# Patient Record
Sex: Male | Born: 1995 | Hispanic: Yes | Marital: Single | Smoking: Never smoker
Health system: Southern US, Community
[De-identification: ages and names within clinical notes are randomized; demographics above are authoritative.]

---

## 2019-01-27 ENCOUNTER — Emergency Department: Payer: No Typology Code available for payment source

## 2019-01-27 ENCOUNTER — Other Ambulatory Visit: Payer: Self-pay

## 2019-01-27 ENCOUNTER — Emergency Department
Admission: EM | Admit: 2019-01-27 | Discharge: 2019-01-27 | Disposition: A | Discharge status: Home / Self Care | Payer: No Typology Code available for payment source | Attending: Emergency Medicine | Admitting: Emergency Medicine

## 2019-01-27 ENCOUNTER — Encounter: Payer: Self-pay | Admitting: Emergency Medicine

## 2019-01-27 DIAGNOSIS — M545 Low back pain: Secondary | ICD-10-CM | POA: Diagnosis not present

## 2019-01-27 DIAGNOSIS — M546 Pain in thoracic spine: Secondary | ICD-10-CM | POA: Insufficient documentation

## 2019-01-27 DIAGNOSIS — M542 Cervicalgia: Secondary | ICD-10-CM | POA: Diagnosis not present

## 2019-01-27 MED ORDER — KETOROLAC TROMETHAMINE 30 MG/ML IJ SOLN
30.0000 mg | Freq: Once | INTRAMUSCULAR | Status: AC
  Administered 2019-01-27: 19:00:00 30 mg via INTRAMUSCULAR
  Filled 2019-01-27: qty 1

## 2019-01-27 MED ORDER — MELOXICAM 15 MG PO TABS: 15.0000 mg | ORAL_TABLET | Freq: Every day | ORAL | 1 refills | Status: AC
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
## 2019-01-27 NOTE — ED Notes (Signed)
Patient transported to X-ray 
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
## 2019-03-07 ENCOUNTER — Emergency Department
Admission: EM | Admit: 2019-03-07 | Discharge: 2019-03-07 | Disposition: A | Discharge status: Home / Self Care | Payer: Medicaid Other | Attending: Student in an Organized Health Care Education/Training Program | Admitting: Student in an Organized Health Care Education/Training Program

## 2019-03-07 ENCOUNTER — Other Ambulatory Visit: Payer: Self-pay

## 2019-03-07 ENCOUNTER — Emergency Department: Payer: Medicaid Other

## 2019-03-07 ENCOUNTER — Encounter: Payer: Self-pay | Admitting: Emergency Medicine

## 2019-03-07 DIAGNOSIS — Y929 Unspecified place or not applicable: Secondary | ICD-10-CM | POA: Diagnosis not present

## 2019-03-07 DIAGNOSIS — Y999 Unspecified external cause status: Secondary | ICD-10-CM | POA: Diagnosis not present

## 2019-03-07 DIAGNOSIS — S90512A Abrasion, left ankle, initial encounter: Secondary | ICD-10-CM | POA: Insufficient documentation

## 2019-03-07 DIAGNOSIS — S80812A Abrasion, left lower leg, initial encounter: Secondary | ICD-10-CM | POA: Diagnosis not present

## 2019-03-07 DIAGNOSIS — S8012XA Contusion of left lower leg, initial encounter: Secondary | ICD-10-CM | POA: Diagnosis not present

## 2019-03-07 DIAGNOSIS — Z23 Encounter for immunization: Secondary | ICD-10-CM | POA: Insufficient documentation

## 2019-03-07 DIAGNOSIS — T07XXXA Unspecified multiple injuries, initial encounter: Secondary | ICD-10-CM

## 2019-03-07 DIAGNOSIS — S8992XA Unspecified injury of left lower leg, initial encounter: Secondary | ICD-10-CM | POA: Diagnosis present

## 2019-03-07 DIAGNOSIS — Y9389 Activity, other specified: Secondary | ICD-10-CM | POA: Diagnosis not present

## 2019-03-07 MED ORDER — TETANUS-DIPHTH-ACELL PERTUSSIS 5-2.5-18.5 LF-MCG/0.5 IM SUSP
0.5000 mL | Freq: Once | INTRAMUSCULAR | Status: AC
  Administered 2019-03-07: 16:00:00 0.5 mL via INTRAMUSCULAR
  Filled 2019-03-07: qty 0.5

## 2019-03-07 NOTE — Discharge Instructions (Signed)
Follow-up with your primary care provider, [facility] clinic or return to the emergency department if any worsening or signs of infection.  Ice and elevate your leg tonight to reduce swelling and help with pain.  You may take ibuprofen or Tylenol if needed for pain.  Clean the abrasions daily with mild soap and water.  If any signs of infection you will need to be seen for antibiotics.
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
## 2019-03-07 NOTE — ED Notes (Signed)
Abrasions cleaned, dry and gauze applied by this RN.
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
## 2021-03-21 ENCOUNTER — Other Ambulatory Visit: Payer: Self-pay

## 2021-03-21 ENCOUNTER — Ambulatory Visit (INDEPENDENT_AMBULATORY_CARE_PROVIDER_SITE_OTHER): Payer: Medicaid Other | Admitting: Gastroenterology

## 2021-03-21 ENCOUNTER — Encounter: Payer: Self-pay | Admitting: Gastroenterology

## 2021-03-21 VITALS — BP 142/71 | HR 78 | Temp 98.8°F | Ht 71.0 in | Wt 167.0 lb

## 2021-03-21 DIAGNOSIS — R1013 Epigastric pain: Secondary | ICD-10-CM | POA: Diagnosis not present

## 2021-03-21 DIAGNOSIS — G8929 Other chronic pain: Secondary | ICD-10-CM

## 2021-03-21 DIAGNOSIS — K921 Melena: Secondary | ICD-10-CM

## 2021-03-21 NOTE — Progress Notes (Signed)
Arlyss Repress, MD [address]  [address]  [place], [place] [address]  Main: [phone]  Fax: [phone]    Gastroenterology Consultation  Referring Provider:     Beverley Fiedler, NP Primary Care Physician:  Patient, No Pcp Per (Inactive) Primary Gastroenterologist:  Dr. Arlyss Repress Reason for Consultation:     Epigastric pain        HPI:   Dale Morris is a 25 y.o. male referred by Dr. Patient, No Pcp Per (Inactive)  for consultation & management of epigastric pain.  Patient reports that since July 2022, he has been experiencing epigastric pain, worse postprandial associated with early satiety, upper abdominal bloating, belching.  His PCP started him on omeprazole 40 mg daily, he did not take it since Monday.  He reports having black tarry stool over the weekend.  He went to ER in July, labs were unrevealing including serum lipase, CBC, CMP, troponin, creatinine kinase.  He was COVID-19 positive.  Last year, he reports that he was having severe reflux symptoms, was taking Tums, he was overweight to 240 pounds.  He reports that he eliminated all the carbonated beverages, high calorie foods and lost weight.  He works in [employer] Does not smoke or drink alcohol  Patient just applied for insurance through his work No known family history of GI malignancy Denies any abdominal surgeries  NSAIDs: None  Antiplts/Anticoagulants/Anti thrombotics: None  GI Procedures: None  History reviewed. No pertinent past medical history.  History reviewed. No pertinent surgical history.  Current Outpatient Medications:    omeprazole (PRILOSEC) 40 MG capsule, Take 40 mg by mouth daily., Disp: , Rfl:   History reviewed. No pertinent family history.   Social History   Tobacco Use   Smoking status: Never   Smokeless tobacco: Never  Substance Use Topics   Alcohol use: Never   Drug use: Never    Allergies as of 03/21/2021   (No Known Allergies)    Review of  Systems:    All systems reviewed and negative except where noted in HPI.   Physical Exam:  BP (!) 142/71 (BP Location: Right Arm, Patient Position: Sitting, Cuff Size: Normal)   Pulse 78   Temp 98.8 F (37.1 C) (Oral)   Ht 5\' 11"  (1.803 m)   Wt 167 lb (75.8 kg)   BMI 23.29 kg/m  No LMP for male patient.  General:   Alert,  Well-developed, well-nourished, pleasant and cooperative in NAD Head:  Normocephalic and atraumatic. Eyes:  Sclera clear, no icterus.   Conjunctiva pink. Ears:  Normal auditory acuity. Nose:  No deformity, discharge, or lesions. Mouth:  No deformity or lesions,oropharynx pink & moist. Neck:  Supple; no masses or thyromegaly. Lungs:  Respirations even and unlabored.  Clear throughout to auscultation.   No wheezes, crackles, or rhonchi. No acute distress. Heart:  Regular rate and rhythm; no murmurs, clicks, rubs, or gallops. Abdomen:  Normal bowel sounds. Soft, non-tender and non-distended without masses, hepatosplenomegaly or hernias noted.  No guarding or rebound tenderness.   Rectal: Not performed Msk:  Symmetrical without gross deformities. Good, equal movement & strength bilaterally. Pulses:  Normal pulses noted. Extremities:  No clubbing or edema.  No cyanosis. Neurologic:  Alert and oriented x3;  grossly normal neurologically. Skin:  Intact without significant lesions or rashes. No jaundice. Psych:  Alert and cooperative. Normal mood and affect.  Imaging Studies: None  Assessment and Plan:   Dale Morris is a 25 y.o. Hispanic  male with no significant past medical history is seen in consultation for chronic symptoms of dyspepsia and black tarry stools. Labs in July did not reveal anemia  Recheck CBC today Check H. pylori IgG since patient is on PPI, H. pylori breath test and treat if either one returns positive If patient has anemia, recommend upper endoscopy for further evaluation  Follow up in 6 weeks   Arlyss Repress, MD

## 2021-03-22 LAB — H. PYLORI ANTIBODY, IGG: H. pylori, IgG AbS: 4.15 Index Value — ABNORMAL HIGH (ref 0.00–0.79)

## 2021-03-22 LAB — CBC
Hematocrit: 43.7 % (ref 37.5–51.0)
Hemoglobin: 15.5 g/dL (ref 13.0–17.7)
MCH: 31.4 pg (ref 26.6–33.0)
MCHC: 35.5 g/dL (ref 31.5–35.7)
MCV: 89 fL (ref 79–97)
Platelets: 269 10*3/uL (ref 150–450)
RBC: 4.93 x10E6/uL (ref 4.14–5.80)
RDW: 12.2 % (ref 11.6–15.4)
WBC: 5.9 10*3/uL (ref 3.4–10.8)

## 2021-03-22 LAB — H. PYLORI BREATH TEST: H pylori Breath Test: POSITIVE — AB

## 2021-03-22 NOTE — Progress Notes (Signed)
Test positive for H. pylori infection, please inform patient Recommend treatment with triple therapy to treat H Pylori for 14days No evidence of anemia, therefore do not recommend upper endoscopy at this time  Omeprazole 20mg  BID Clarithromycin 500mg  BID Amoxicillin 1gm BID  Order H Pylori breath test in 4weeks after completing medication to confirm eradication.   Thanks RV

## 2021-03-23 ENCOUNTER — Telehealth: Payer: Self-pay

## 2021-03-23 DIAGNOSIS — A048 Other specified bacterial intestinal infections: Secondary | ICD-10-CM

## 2021-03-23 MED ORDER — OMEPRAZOLE 20 MG PO CPDR: 20.0000 mg | DELAYED_RELEASE_CAPSULE | Freq: Two times a day (BID) | ORAL | 0 refills | Status: DC

## 2021-03-23 MED ORDER — AMOXICILLIN 500 MG PO TABS: 1000.0000 mg | ORAL_TABLET | Freq: Two times a day (BID) | ORAL | 0 refills | Status: AC

## 2021-03-23 MED ORDER — CLARITHROMYCIN 500 MG PO TABS: 500.0000 mg | ORAL_TABLET | Freq: Two times a day (BID) | ORAL | 0 refills | Status: AC

## 2021-03-23 NOTE — Telephone Encounter (Signed)
Order the h pylori in 6 weeks Pended the medication because will ask patient what pharmacy he wants the medication sent to.

## 2021-03-23 NOTE — Telephone Encounter (Signed)
-----   Message from Toney Reil, MD sent at 03/22/2021  5:32 PM EDT ----- Test positive for H. pylori infection, please inform patient Recommend treatment with triple therapy to treat H Pylori for 14days No evidence of anemia, therefore do not recommend upper endoscopy at this time  Omeprazole 20mg  BID Clarithromycin 500mg  BID Amoxicillin 1gm BID  Order H Pylori breath test in 4weeks after completing medication to confirm eradication.   Thanks RV

## 2021-03-23 NOTE — Telephone Encounter (Signed)
Patient verbalized understadning of results. He said to send the medication to [facility] in [place]. Sent medication to the pharmacy

## 2021-04-12 ENCOUNTER — Telehealth: Payer: Self-pay | Admitting: Gastroenterology

## 2021-04-12 ENCOUNTER — Telehealth: Payer: Self-pay

## 2021-04-12 NOTE — Telephone Encounter (Signed)
Called patient back he was worried because pharmacy told him to take his meds wrong and now he has read the lable on bottle and relializes he took one of them wrong he only took one pill not two at a time as directed he is gonna finish bottle as directed and call us back if it does not help to come back in for a office visit

## 2021-04-12 NOTE — Telephone Encounter (Signed)
Pt. Calling he think that he took his medication wrong he is requesting a call back

## 2021-05-02 ENCOUNTER — Encounter: Payer: Self-pay | Admitting: Gastroenterology

## 2021-05-02 ENCOUNTER — Ambulatory Visit (INDEPENDENT_AMBULATORY_CARE_PROVIDER_SITE_OTHER): Payer: Medicaid Other | Admitting: Gastroenterology

## 2021-05-02 VITALS — BP 125/75 | HR 59 | Temp 98.8°F | Ht 71.0 in | Wt 169.4 lb

## 2021-05-02 DIAGNOSIS — G8929 Other chronic pain: Secondary | ICD-10-CM

## 2021-05-02 DIAGNOSIS — Z8619 Personal history of other infectious and parasitic diseases: Secondary | ICD-10-CM | POA: Diagnosis not present

## 2021-05-02 DIAGNOSIS — R1013 Epigastric pain: Secondary | ICD-10-CM

## 2021-05-02 DIAGNOSIS — A048 Other specified bacterial intestinal infections: Secondary | ICD-10-CM | POA: Diagnosis not present

## 2021-05-02 NOTE — Progress Notes (Signed)
Dale Repress, MD [address]  [address]  [place], [place] [address]  Main: [phone]  Fax: [phone]    Gastroenterology Consultation  Referring Provider:     No ref. provider found Primary Care Physician:  Patient, No Pcp Per (Inactive) Primary Gastroenterologist:  Dale Morris Reason for Consultation:   Dyspepsia secondary to H. pylori gastritis       HPI:   Dale Morris is a 25 y.o. male referred by Dr. Patient, No Pcp Per (Inactive)  for consultation & management of epigastric pain.  Patient reports that since July 2022, he has been experiencing epigastric pain, worse postprandial associated with early satiety, upper abdominal bloating, belching.  His PCP started him on omeprazole 40 mg daily, he did not take it since Monday.  He reports having black tarry stool over the weekend.  He went to ER in July, labs were unrevealing including serum lipase, CBC, CMP, troponin, creatinine kinase.  He was COVID-19 positive.  Last year, he reports that he was having severe reflux symptoms, was taking Tums, he was overweight to 240 pounds.  He reports that he eliminated all the carbonated beverages, high calorie foods and lost weight.  He works in [employer] Does not smoke or drink alcohol  Patient just applied for insurance through his work No known family history of GI malignancy Denies any abdominal surgeries  Follow-up visit 05/02/2021 Patient is diagnosed with H. pylori infection based on breath test, treated with triple therapy.  He finished antibiotics about 2 weeks ago.  He is currently asymptomatic.  His appetite has improved and able to eat more  NSAIDs: None  Antiplts/Anticoagulants/Anti thrombotics: None  GI Procedures: None  History reviewed. No pertinent past medical history.  History reviewed. No pertinent surgical history.  Current Outpatient Medications:    omeprazole (PRILOSEC) 40 MG capsule, Take 40 mg by mouth daily. (Patient not  taking: Reported on 05/02/2021), Disp: , Rfl:   History reviewed. No pertinent family history.   Social History   Tobacco Use   Smoking status: Never   Smokeless tobacco: Never  Substance Use Topics   Alcohol use: Never   Drug use: Never    Allergies as of 05/02/2021   (No Known Allergies)    Review of Systems:    All systems reviewed and negative except where noted in HPI.   Physical Exam:  BP 125/75 (BP Location: Left Arm, Patient Position: Sitting, Cuff Size: Normal)   Pulse (!) 59   Temp 98.8 F (37.1 C) (Oral)   Ht 5\' 11"  (1.803 m)   Wt 169 lb 6.4 oz (76.8 kg)   BMI 23.63 kg/m  No LMP for male patient.  General:   Alert,  Well-developed, well-nourished, pleasant and cooperative in NAD Head:  Normocephalic and atraumatic. Eyes:  Sclera clear, no icterus.   Conjunctiva pink. Ears:  Normal auditory acuity. Nose:  No deformity, discharge, or lesions. Mouth:  No deformity or lesions,oropharynx pink & moist. Neck:  Supple; no masses or thyromegaly. Lungs:  Respirations even and unlabored.  Clear throughout to auscultation.   No wheezes, crackles, or rhonchi. No acute distress. Heart:  Regular rate and rhythm; no murmurs, clicks, rubs, or gallops. Abdomen:  Normal bowel sounds. Soft, non-tender and non-distended without masses, hepatosplenomegaly or hernias noted.  No guarding or rebound tenderness.   Rectal: Not performed Msk:  Symmetrical without gross deformities. Good, equal movement & strength bilaterally. Pulses:  Normal pulses noted. Extremities:  No clubbing  or edema.  No cyanosis. Neurologic:  Alert and oriented x3;  grossly normal neurologically. Skin:  Intact without significant lesions or rashes. No jaundice. Psych:  Alert and cooperative. Normal mood and affect.  Imaging Studies: None  Assessment and Plan:   Dale Morris is a 25 y.o. Hispanic male with no significant past medical history is seen in consultation for chronic symptoms of  dyspepsia and black tarry stools. Labs in July did not reveal anemia Symptoms secondary to H. pylori infection, s/p triple therapy.  Currently asymptomatic No evidence of anemia Repeat H. pylori breath test in 4 weeks to confirm eradication since patient finished antibiotics about 2 weeks ago  Follow up as needed  Dale Repress, MD

## 2021-05-02 NOTE — Progress Notes (Signed)
Ordered hpylori breath test to be done in future as requested by Dr Allegra Lai

## 2021-06-13 LAB — H. PYLORI BREATH TEST: H pylori Breath Test: NEGATIVE

## 2021-06-14 ENCOUNTER — Telehealth: Payer: Self-pay

## 2021-06-14 NOTE — Telephone Encounter (Signed)
CALLED PATIENT NO ANSWER LEFT VOICEMAIL FOR A CALL BACK ? ?

## 2021-06-14 NOTE — Telephone Encounter (Signed)
-----   Message from Toney Reil, MD sent at 06/14/2021  1:13 PM EST ----- Please inform pt that his repeat breath test came back negative which means he no longer has H Pylori infection  RV

## 2021-06-15 ENCOUNTER — Telehealth: Payer: Self-pay

## 2021-06-15 NOTE — Telephone Encounter (Signed)
-----   Message from Toney Reil, MD sent at 06/14/2021  1:13 PM EST ----- Please inform pt that his repeat breath test came back negative which means he no longer has H Pylori infection  RV

## 2021-06-15 NOTE — Telephone Encounter (Signed)
CALLED PATIENT NO ANSWER LEFT VOICEMAIL FOR A CALL BACK ? ?

## 2021-06-19 ENCOUNTER — Telehealth: Payer: Self-pay

## 2021-06-19 NOTE — Telephone Encounter (Signed)
-----   Message from Toney Reil, MD sent at 06/14/2021  1:13 PM EST ----- Please inform pt that his repeat breath test came back negative which means he no longer has H Pylori infection  RV

## 2021-06-19 NOTE — Telephone Encounter (Signed)
CALLED PATIENT NO ANSWER LEFT VOICEMAIL FOR A CALL BACK  letter sent 

## 2021-06-19 NOTE — Telephone Encounter (Signed)
CALLED PATIENT NO ANSWER LEFT VOICEMAIL FOR A CALL BACK ? ?
